# Patient Record
Sex: Female | Born: 1975 | Race: Asian | Hispanic: No | Marital: Married | Smoking: Never smoker
Health system: Southern US, Community
[De-identification: ages and names within clinical notes are randomized; demographics above are authoritative.]

## PROBLEM LIST (undated history)

## (undated) DIAGNOSIS — Z9889 Other specified postprocedural states: Secondary | ICD-10-CM

## (undated) DIAGNOSIS — R112 Nausea with vomiting, unspecified: Secondary | ICD-10-CM

## (undated) DIAGNOSIS — R51 Headache: Secondary | ICD-10-CM

## (undated) HISTORY — PX: BREAST SURGERY: SHX581

---

## 1997-11-03 ENCOUNTER — Inpatient Hospital Stay (HOSPITAL_COMMUNITY): Admission: RE | Admit: 1997-11-03 | Discharge: 1997-11-06 | Payer: Self-pay | Admitting: Obstetrics

## 1998-01-27 ENCOUNTER — Inpatient Hospital Stay (HOSPITAL_COMMUNITY): Admission: RE | Admit: 1998-01-27 | Discharge: 1998-01-27 | Payer: Self-pay | Admitting: *Deleted

## 1998-03-07 ENCOUNTER — Inpatient Hospital Stay (HOSPITAL_COMMUNITY): Admission: AD | Admit: 1998-03-07 | Discharge: 1998-03-09 | Payer: Self-pay | Admitting: *Deleted

## 1999-08-17 ENCOUNTER — Ambulatory Visit (HOSPITAL_COMMUNITY): Admission: RE | Admit: 1999-08-17 | Discharge: 1999-08-17 | Payer: Self-pay | Admitting: *Deleted

## 1999-10-03 ENCOUNTER — Ambulatory Visit (HOSPITAL_COMMUNITY): Admission: RE | Admit: 1999-10-03 | Discharge: 1999-10-03 | Payer: Self-pay | Admitting: *Deleted

## 2000-03-03 ENCOUNTER — Inpatient Hospital Stay (HOSPITAL_COMMUNITY): Admission: AD | Admit: 2000-03-03 | Discharge: 2000-03-04 | Payer: Self-pay | Admitting: *Deleted

## 2003-09-09 ENCOUNTER — Encounter: Admission: RE | Admit: 2003-09-09 | Discharge: 2003-09-09 | Payer: Self-pay | Admitting: Family Medicine

## 2003-09-09 ENCOUNTER — Other Ambulatory Visit: Admission: RE | Admit: 2003-09-09 | Discharge: 2003-09-09 | Payer: Self-pay | Admitting: Family Medicine

## 2003-09-09 ENCOUNTER — Encounter (INDEPENDENT_AMBULATORY_CARE_PROVIDER_SITE_OTHER): Payer: Self-pay | Admitting: *Deleted

## 2003-09-09 ENCOUNTER — Encounter (INDEPENDENT_AMBULATORY_CARE_PROVIDER_SITE_OTHER): Payer: Self-pay | Admitting: Specialist

## 2003-09-23 ENCOUNTER — Encounter: Admission: RE | Admit: 2003-09-23 | Discharge: 2003-09-23 | Payer: Self-pay | Admitting: Obstetrics and Gynecology

## 2003-11-11 ENCOUNTER — Encounter: Admission: RE | Admit: 2003-11-11 | Discharge: 2003-11-11 | Payer: Self-pay | Admitting: Family Medicine

## 2004-04-25 ENCOUNTER — Ambulatory Visit: Payer: Self-pay | Admitting: Obstetrics and Gynecology

## 2004-07-04 ENCOUNTER — Other Ambulatory Visit: Admission: RE | Admit: 2004-07-04 | Discharge: 2004-07-04 | Payer: Self-pay | Admitting: Obstetrics & Gynecology

## 2004-07-04 ENCOUNTER — Ambulatory Visit: Payer: Self-pay | Admitting: Obstetrics and Gynecology

## 2004-07-20 ENCOUNTER — Ambulatory Visit: Payer: Self-pay | Admitting: Family Medicine

## 2004-10-27 ENCOUNTER — Ambulatory Visit: Payer: Self-pay | Admitting: Family Medicine

## 2004-10-27 ENCOUNTER — Other Ambulatory Visit: Admission: RE | Admit: 2004-10-27 | Discharge: 2004-10-27 | Payer: Self-pay | Admitting: Family Medicine

## 2005-02-23 ENCOUNTER — Ambulatory Visit: Payer: Self-pay | Admitting: Family Medicine

## 2005-03-13 ENCOUNTER — Encounter: Payer: Self-pay | Admitting: Obstetrics and Gynecology

## 2005-03-13 ENCOUNTER — Ambulatory Visit: Payer: Self-pay | Admitting: Obstetrics and Gynecology

## 2006-03-08 ENCOUNTER — Encounter (INDEPENDENT_AMBULATORY_CARE_PROVIDER_SITE_OTHER): Payer: Self-pay | Admitting: Gynecology

## 2006-03-08 ENCOUNTER — Ambulatory Visit: Payer: Self-pay | Admitting: Gynecology

## 2008-02-20 ENCOUNTER — Encounter: Payer: Self-pay | Admitting: Obstetrics & Gynecology

## 2008-02-20 ENCOUNTER — Ambulatory Visit: Payer: Self-pay | Admitting: Obstetrics & Gynecology

## 2010-10-03 NOTE — Group Therapy Note (Signed)
NAMELetasha, [name]                    ACCOUNT NO.:  [id number]   MEDICAL RECORD NO.:  [id number]          PATIENT TYPE:  WOC   LOCATION:  [facility]                   FACILITY:  [facility]   PHYSICIAN:  Johnella Moloney, MD        DATE OF BIRTH:  1976-04-20   DATE OF SERVICE:                                  CLINIC NOTE   The patient is a 35 year old, gravida 2, para 2, with a last menstrual  period of January 23, 2008, who is here for her annual exam.  The  patient was last seen in October 2007 for her annual, at which point she  had a normal Pap smear.  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  The patient has been lost to follow up due to  various situations in her life, but is here today to re-initiate care.  The patient does not have any complaints.  She is sexually active with  her husband, does not have any problems during intercourse, denies any  intermenstrual bleeding or any other symptoms.  She is interested in  using a Mirena IUD for contraception.   PAST OB/GYN HISTORY:  G2, P2, vaginal delivery x2.  The patient has  regular menstrual periods which lasts 4 days.  She uses condoms for  birth control.  Currently, she has no problems during sexual  intercourse.  The patient had a history of CIN 3 in the past status post  LEEP in 2006.  She had a normal Pap smear in 2007 and has not had any  further Pap smears since then.   PAST MEDICAL HISTORY:  None.   PAST SURGICAL HISTORY:  LEEP.   MEDICATIONS:  None.   ALLERGIES:  None.   SOCIAL HISTORY:  The patient is currently unemployed.  She lives with  her family.  She denies any tobacco, alcohol, or illicit drug use.  Denies any current or past history of sexual or physical abuse.   Family history is remarkable for a history of cervical cancer and  hypertension.  No other gynecologic cancers, breast cancer, or colon  cancer.   REVIEW OF  SYSTEMS:  A 14-point comprehensive review of system was  reviewed with the patient and was entirely negative.   PHYSICAL EXAMINATION:  VITAL SIGNS:  Temperature is 98.5, pulse is 78,  respirations 16, blood pressure is 113/75, weight is 130 pounds, height  is 5 feet.  GENERAL:  In no apparent distress.  HEENT:  Normocephalic, atraumatic.  LUNGS:  Clear to auscultation bilaterally.  HEART:  Regular rate and rhythm.  ABDOMEN:  Soft, nontender, nondistended.  EXTREMITIES:  No clubbing, cyanosis, or edema.  Nontender.  PELVIC:  The patient has normal external female genitalia, pink well-  rugated vagina, no lesions, normal discharge.  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  An attempt was  made to pass the Cytobrush through the external os, but this was not  possible.  A Pap smear was obtained.  On bimanual exam, normal-sizes  uterus, mobile, nontender, normal adnexa bilaterally.   ASSESSMENT AND PLAN:  The patient is a 35 year old, G2, P2, status post  loop electrocautery excision procedure in 2006 who is here for annual  exam.  The patient had a normal breast examination.  A Pap smear sample  was obtained today; however, the patient was noted to have cervical  stenosis.  We will have to follow up the results of the Pap smear.  If  the Pap smear is insufficient given lack of endocervical samples, the  patient will be called and instructed to place Cytotec 200 mcg per  vagina the night before her next appointment.  Hopefully, this will help  alleviate some mild cervical stenosis and help in collecting an  endocervical specimen.  The patient was told the plan and was agreeing  to plan.  The cervical stenosis can also pose a problem for an  intrauterine device placement.  She was told that we will follow up the  Pap smears and will make other arrangements for her Mirena intrauterine  device placement.  Her reflex gonorrhea and Chlamydia were tested  from  the Pap sample to rule out infection.  We will follow up the results of  the Pap smear and act accordingly.            ______________________________  Johnella Moloney, MD     UD/MEDQ  D:  02/20/2008  T:  02/20/2008  Job:  [id number]

## 2010-10-06 NOTE — Group Therapy Note (Signed)
NAMEAdeli, [name]                    ACCOUNT NO.:  [id number]   MEDICAL RECORD NO.:  [id number]          PATIENT TYPE:  WOC   LOCATION:  [facility]                   FACILITY:  [facility]   PHYSICIAN:  Tinnie Gens, MD        DATE OF BIRTH:  Apr 01, 1976   DATE OF SERVICE:                                    CLINIC NOTE   CHIEF COMPLAINT:  LEEP.   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  Followup Pap smear showed CIN II again.  Colposcopy was performed, revealed  CIN II.  The patient was scheduled for LEEP.   PROCEDURE:  After informed consent, the patient was placed in dorsal  lithotomy.  A Teflon coated speculum was placed inside the vagina.  A  colposcopy was performed.  Acetyl white changes noted at the 10-12 o'clock  position with the end of lesion being seen out farther than the cervical  canal and in the transformation zone.  The cervix was injected with 10 mL of  lidocaine for a paracervical block.  A medium size Fisher loop was then used  to perform a LEEP conization.  The base of the conization was cauterized  with electrocautery and Monsel's applied to the base for hemostasis.  Good  hemostasis was noted and all instruments were removed from the patient's  vagina.   IMPRESSION:  CIN II, failed cryotherapy.   PLAN:  1.  Status post LEEP today.  2.  Followup Pap every four months.  3.  The patient was given post LEEP instructions and care.       TP/MEDQ  D:  10/27/2004  T:  10/27/2004  Job:  [id number]

## 2010-10-06 NOTE — Group Therapy Note (Signed)
NAMETametha, [name]                    ACCOUNT NO.:  [id number]   MEDICAL RECORD NO.:  [id number]          PATIENT TYPE:  WOC   LOCATION[facility]                   [facility]ACILITY:  [facility]   PHYSICIAN:  Argentina Donovan, MD        DATE OF BIRTH:  1976-04-21   DATE OF SERVICE:  03/13/2005                                    CLINIC NOTE   The patient is a 35 year old Asian female with a previous CIN III Pap smear  that had a LEEP in June of this year back for routine Pap which has been  taken.  The patient also wanted to discuss the NuvaRing.  We discussed it in  detail and gave her two samples of it.  We explained the European and  American method for using the ring and the possible complications.           ______________________________  Argentina Donovan, MD     PR/MEDQ  D:  03/13/2005  T:  03/14/2005  Job:  [id number]

## 2010-10-06 NOTE — Group Therapy Note (Signed)
NAMEAngellina, Ferdinand Kelly                    ACCOUNT NO.:  [id number]   MEDICAL RECORD NO.:  [id number]          PATIENT TYPE:  WOC   LOCATION[facility]                   [facility]ACILITY:  [facility]   PHYSICIAN:  Ginger Carne, MD DATE OF BIRTH:  1975/06/07   DATE OF SERVICE:                                    CLINIC NOTE   HISTORY OF PRESENT ILLNESS:  This patient is seen in clinic today for yearly  examination.  She had a CIN III findings on colposcopy and a LEEP was  performed on October 27, 2004.  The patient has no specific complaints.  Uses  NuvaRing for birth control and her menses are regular.   PHYSICAL EXAMINATION:  VITAL SIGNS:  Blood pressure 116/75, weight 129  pounds.  HEENT:  Grossly normal.  Breast exam without masses, discharge, thickenings  or tenderness, bilateral breast implants noted.  CARDIOVASCULAR:  Without  murmurs or enlargements,regular rate and rhythm.  LUNGS:  Clear  EXTREMITIES:  Lymphatic, skin, neurological, musculoskeletal, vascular  systems normal.  ABDOMEN:  Soft without gross hepatosplenomegaly.  PELVIC:  Pap smear performed.  External genitalia, vulva and vagina normal  Cervix smooth without erosions or lesions.  Uterus small, anteverted and  flexed and both adnexa normal.   IMPRESSION:  Normal gynecological evaluation.   PLAN:  The patient prescribed NuvaRing for 1 year used as directed, will  follow-up in 1 year time.           ______________________________  Ginger Carne, MD     SHB/MEDQ  D:  03/08/2006  T:  03/10/2006  Job:  [id number]

## 2012-07-23 ENCOUNTER — Other Ambulatory Visit (HOSPITAL_COMMUNITY): Payer: Self-pay | Admitting: Orthopaedic Surgery

## 2012-08-01 ENCOUNTER — Encounter (HOSPITAL_COMMUNITY): Payer: Self-pay | Admitting: Pharmacy Technician

## 2012-08-09 NOTE — Pre-Procedure Instructions (Signed)
[name]  08/09/2012   Your procedure is scheduled on:  Mon, Mar 31 @ 12:30 PM  Report to [facility] at 10:30 AM.  Call this number if you have problems the morning of surgery: [phone]   Remember:   Do not eat food or drink liquids after midnight.   Take these medicines the morning of surgery with A SIP OF WATER: Pain Pill(if needed)              Stop taking your Lodine and Ibuprofen.No Goody's,BC's,Aleve,Fish Oil,or any Herbal Medications   Do not wear jewelry, make-up or nail polish.  Do not wear lotions, powders, or perfumes. You may wear deodorant.  Do not shave 48 hours prior to surgery.  Do not bring valuables to the hospital.  Contacts, dentures or bridgework may not be worn into surgery.  Leave suitcase in the car. After surgery it may be brought to your room.  For patients admitted to the hospital, checkout time is 11:00 AM the day of  discharge.   Patients discharged the day of surgery will not be allowed to drive  home.    Special Instructions: Shower using CHG 2 nights before surgery and the night before surgery.  If you shower the day of surgery use CHG.  Use special wash - you have one bottle of CHG for all showers.  You should use approximately 1/3 of the bottle for each shower.   Please read over the following fact sheets that you were given: Pain Booklet, Coughing and Deep Breathing, MRSA Information and Surgical Site Infection Prevention

## 2012-08-11 ENCOUNTER — Encounter (HOSPITAL_COMMUNITY)
Admission: RE | Admit: 2012-08-11 | Discharge: 2012-08-11 | Disposition: A | Discharge status: Home / Self Care | Payer: BC Managed Care – PPO | Source: Ambulatory Visit | Attending: Orthopaedic Surgery | Admitting: Orthopaedic Surgery

## 2012-08-11 ENCOUNTER — Encounter (HOSPITAL_COMMUNITY): Payer: Self-pay

## 2012-08-11 HISTORY — DX: Headache: R51

## 2012-08-11 HISTORY — DX: Other specified postprocedural states: Z98.890

## 2012-08-11 HISTORY — DX: Nausea with vomiting, unspecified: R11.2

## 2012-08-11 LAB — URINALYSIS, ROUTINE W REFLEX MICROSCOPIC
Glucose, UA: NEGATIVE mg/dL
Leukocytes, UA: NEGATIVE
Nitrite: NEGATIVE
Protein, ur: NEGATIVE mg/dL

## 2012-08-11 LAB — CBC
HCT: 38.8 % (ref 36.0–46.0)
MCHC: 35.1 g/dL (ref 30.0–36.0)
MCV: 89.4 fL (ref 78.0–100.0)
RDW: 12.7 % (ref 11.5–15.5)

## 2012-08-11 LAB — COMPREHENSIVE METABOLIC PANEL
Albumin: 4 g/dL (ref 3.5–5.2)
BUN: 8 mg/dL (ref 6–23)
Chloride: 99 mEq/L (ref 96–112)
Creatinine, Ser: 0.53 mg/dL (ref 0.50–1.10)
GFR calc Af Amer: >90 mL/min (ref >90)
Total Bilirubin: 0.9 mg/dL (ref 0.3–1.2)
Total Protein: 7.6 g/dL (ref 6.0–8.3)

## 2012-08-11 LAB — HCG, SERUM, QUALITATIVE: Preg, Serum: NEGATIVE

## 2012-08-14 NOTE — H&P (Signed)
  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  She had 1 epidural that was placed at 5-1 due to the severe stenosis at the 4-5 level.  She has lost weight.  She has been on pain medication for several months.  She has had symptoms for greater than 6 months.  She has taken Neurontin, done activity modification, and she has back pain, right leg pain, numbness that radiates down to the dorsum of her foot, significantly increased pain with activities, and sometimes has to stop and go lie down to get some degree of relief.  She is continuing to take gabapentin 300 mg twice a day.  She has been on tramadol in the past; takes potassium chloride 50 mg p.o. daily.  She has been on Flexeril 10 mg at night.  She has not had any previous surgeries. No known drug allergies.   FAMILY HISTORY:  Positive for diabetes and hypertension.   SOCIAL HISTORY:  She is married to her husband, Eustace Pen.  She does not smoke or drink.   FOURTEEN-POINT REVIEW OF SYSTEMS:  Negative for rheumatologic disease.   PHYSICAL EXAMINATION:  Patient is alert and oriented, well developed, 5 feet tall, 112 pounds.  Extraocular movements intact.  Lungs:  Clear.  Heart:  Regular rate and rhythm without murmur or gallop.  Abdomen:  Soft, nontender.  She has positive straight leg raising on the right at 50 degrees.  Positive popliteal compression test on the right, negative on the left.  EHL is 1 grade weak.  She has dorsal foot numbness on the right, none on the left.  She fatigues out with resisted anterior tib function.   ASSESSMENT:  Large extruded HNP.  She has failed time, anti-inflammatories, medications, and has had symptoms greater than 6  months; has had epidural steroids that gave her about 50% relief.   PLAN:  She states she would like to proceed with operative intervention.  Her MRI scan was 06/25/2012.  The plan would be microdiskectomy, overnight stay.  Procedure discussed; risks of bleeding, infection, dural tear, re-operation, and disk herniation.  Procedure discussed and risks discussed.  She understands and requests we proceed.   For additional information please see handwritten notes, reports, orders and prescriptions in this chart.      Mark C. Ophelia Charter, M.D.

## 2012-08-18 ENCOUNTER — Ambulatory Visit (HOSPITAL_COMMUNITY): Payer: BC Managed Care – PPO | Admitting: Certified Registered"

## 2012-08-18 ENCOUNTER — Encounter (HOSPITAL_COMMUNITY)
Admission: RE | Disposition: A | Discharge status: Home / Self Care | Payer: Self-pay | Source: Ambulatory Visit | Attending: Orthopaedic Surgery

## 2012-08-18 ENCOUNTER — Encounter (HOSPITAL_COMMUNITY): Payer: Self-pay | Admitting: *Deleted

## 2012-08-18 ENCOUNTER — Ambulatory Visit (HOSPITAL_COMMUNITY): Payer: BC Managed Care – PPO

## 2012-08-18 ENCOUNTER — Encounter (HOSPITAL_COMMUNITY): Payer: Self-pay | Admitting: Certified Registered"

## 2012-08-18 ENCOUNTER — Observation Stay (HOSPITAL_COMMUNITY)
Admission: RE | Admit: 2012-08-18 | Discharge: 2012-08-19 | Disposition: A | Discharge status: Home / Self Care | Payer: BC Managed Care – PPO | Source: Ambulatory Visit | Attending: Orthopaedic Surgery | Admitting: Orthopaedic Surgery

## 2012-08-18 DIAGNOSIS — M5126 Other intervertebral disc displacement, lumbar region: Principal | ICD-10-CM | POA: Diagnosis present

## 2012-08-18 DIAGNOSIS — Z01812 Encounter for preprocedural laboratory examination: Secondary | ICD-10-CM | POA: Insufficient documentation

## 2012-08-18 DIAGNOSIS — Z01810 Encounter for preprocedural cardiovascular examination: Secondary | ICD-10-CM | POA: Insufficient documentation

## 2012-08-18 DIAGNOSIS — Z01818 Encounter for other preprocedural examination: Secondary | ICD-10-CM | POA: Insufficient documentation

## 2012-08-18 DIAGNOSIS — M216X9 Other acquired deformities of unspecified foot: Secondary | ICD-10-CM | POA: Insufficient documentation

## 2012-08-18 HISTORY — PX: LUMBAR LAMINECTOMY: SHX95

## 2012-08-18 SURGERY — MICRODISCECTOMY LUMBAR LAMINECTOMY
Anesthesia: General | Site: Spine Lumbar | Laterality: Right | Wound class: Clean

## 2012-08-18 MED ORDER — SENNOSIDES-DOCUSATE SODIUM 8.6-50 MG PO TABS: 1.0000 | ORAL_TABLET | Freq: Every evening | ORAL | Status: DC | PRN

## 2012-08-18 MED ORDER — SCOPOLAMINE 1 MG/3DAYS TD PT72
MEDICATED_PATCH | TRANSDERMAL | Status: AC
  Administered 2012-08-18: 1 via TRANSDERMAL
  Filled 2012-08-18: qty 1

## 2012-08-18 MED ORDER — METHOCARBAMOL 100 MG/ML IJ SOLN
500.0000 mg | Freq: Four times a day (QID) | INTRAVENOUS | Status: DC | PRN
  Filled 2012-08-18: qty 5

## 2012-08-18 MED ORDER — ROCURONIUM BROMIDE 100 MG/10ML IV SOLN
INTRAVENOUS | Status: DC | PRN
  Administered 2012-08-18: 40 mg via INTRAVENOUS

## 2012-08-18 MED ORDER — HYDROCODONE-ACETAMINOPHEN 5-325 MG PO TABS
1.0000 | ORAL_TABLET | ORAL | Status: DC | PRN
  Administered 2012-08-19 (×2): 2 via ORAL
  Filled 2012-08-18 (×2): qty 2

## 2012-08-18 MED ORDER — ACETAMINOPHEN 650 MG RE SUPP: 650.0000 mg | RECTAL | Status: DC | PRN

## 2012-08-18 MED ORDER — NEOSTIGMINE METHYLSULFATE 1 MG/ML IJ SOLN
INTRAMUSCULAR | Status: DC | PRN
  Administered 2012-08-18: 4 mg via INTRAVENOUS

## 2012-08-18 MED ORDER — SODIUM CHLORIDE 0.9 % IV SOLN: 250.0000 mL | INTRAVENOUS | Status: DC

## 2012-08-18 MED ORDER — LACTATED RINGERS IV SOLN
INTRAVENOUS | Status: DC
  Administered 2012-08-18: 12:00:00 via INTRAVENOUS

## 2012-08-18 MED ORDER — OXYCODONE-ACETAMINOPHEN 5-325 MG PO TABS
ORAL_TABLET | ORAL | Status: AC
  Filled 2012-08-18: qty 1

## 2012-08-18 MED ORDER — PROPOFOL 10 MG/ML IV BOLUS
INTRAVENOUS | Status: DC | PRN
  Administered 2012-08-18: 140 mg via INTRAVENOUS

## 2012-08-18 MED ORDER — ACETAMINOPHEN 325 MG PO TABS: 650.0000 mg | ORAL_TABLET | ORAL | Status: DC | PRN

## 2012-08-18 MED ORDER — HYDROCODONE-ACETAMINOPHEN 5-325 MG PO TABS: 1.0000 | ORAL_TABLET | ORAL | Status: DC | PRN

## 2012-08-18 MED ORDER — ZOLPIDEM TARTRATE 5 MG PO TABS: 5.0000 mg | ORAL_TABLET | Freq: Every evening | ORAL | Status: DC | PRN

## 2012-08-18 MED ORDER — BISACODYL 10 MG RE SUPP: 10.0000 mg | Freq: Every day | RECTAL | Status: DC | PRN

## 2012-08-18 MED ORDER — ETODOLAC 400 MG PO TABS
400.0000 mg | ORAL_TABLET | Freq: Two times a day (BID) | ORAL | Status: DC | PRN
  Filled 2012-08-18: qty 1

## 2012-08-18 MED ORDER — ESMOLOL HCL 10 MG/ML IV SOLN
INTRAVENOUS | Status: DC | PRN
  Administered 2012-08-18 (×2): 50 mg via INTRAVENOUS

## 2012-08-18 MED ORDER — DOCUSATE SODIUM 100 MG PO CAPS
100.0000 mg | ORAL_CAPSULE | Freq: Two times a day (BID) | ORAL | Status: DC
  Administered 2012-08-18 – 2012-08-19 (×2): 100 mg via ORAL
  Filled 2012-08-18 (×3): qty 1

## 2012-08-18 MED ORDER — MORPHINE SULFATE 2 MG/ML IJ SOLN: 1.0000 mg | INTRAMUSCULAR | Status: DC | PRN

## 2012-08-18 MED ORDER — MENTHOL 3 MG MT LOZG: 1.0000 | LOZENGE | OROMUCOSAL | Status: DC | PRN

## 2012-08-18 MED ORDER — FENTANYL CITRATE 0.05 MG/ML IJ SOLN
INTRAMUSCULAR | Status: DC | PRN
  Administered 2012-08-18 (×2): 50 ug via INTRAVENOUS

## 2012-08-18 MED ORDER — OXYCODONE-ACETAMINOPHEN 5-325 MG PO TABS
1.0000 | ORAL_TABLET | ORAL | Status: DC | PRN
  Administered 2012-08-18: 1 via ORAL

## 2012-08-18 MED ORDER — LIDOCAINE HCL (CARDIAC) 20 MG/ML IV SOLN
INTRAVENOUS | Status: DC | PRN
  Administered 2012-08-18: 80 mg via INTRAVENOUS

## 2012-08-18 MED ORDER — METHOCARBAMOL 500 MG PO TABS: 500.0000 mg | ORAL_TABLET | Freq: Four times a day (QID) | ORAL | Status: DC | PRN

## 2012-08-18 MED ORDER — SODIUM CHLORIDE 0.9 % IJ SOLN
3.0000 mL | Freq: Two times a day (BID) | INTRAMUSCULAR | Status: DC
  Administered 2012-08-18: 3 mL via INTRAVENOUS

## 2012-08-18 MED ORDER — PANTOPRAZOLE SODIUM 40 MG IV SOLR
40.0000 mg | Freq: Every day | INTRAVENOUS | Status: DC
  Administered 2012-08-18: 40 mg via INTRAVENOUS
  Filled 2012-08-18 (×2): qty 40

## 2012-08-18 MED ORDER — GLYCOPYRROLATE 0.2 MG/ML IJ SOLN
INTRAMUSCULAR | Status: DC | PRN
  Administered 2012-08-18: .7 mg via INTRAVENOUS

## 2012-08-18 MED ORDER — KCL IN DEXTROSE-NACL 20-5-0.45 MEQ/L-%-% IV SOLN
INTRAVENOUS | Status: DC
  Filled 2012-08-18 (×3): qty 1000

## 2012-08-18 MED ORDER — PHENOL 1.4 % MT LIQD: 1.0000 | OROMUCOSAL | Status: DC | PRN

## 2012-08-18 MED ORDER — METHOCARBAMOL 500 MG PO TABS
ORAL_TABLET | ORAL | Status: AC
  Filled 2012-08-18: qty 1

## 2012-08-18 MED ORDER — HYDROMORPHONE HCL PF 1 MG/ML IJ SOLN: 0.2500 mg | INTRAMUSCULAR | Status: DC | PRN

## 2012-08-18 MED ORDER — ACETAMINOPHEN 10 MG/ML IV SOLN: 1000.0000 mg | Freq: Once | INTRAVENOUS | Status: DC | PRN

## 2012-08-18 MED ORDER — FLEET ENEMA 7-19 GM/118ML RE ENEM: 1.0000 | ENEMA | Freq: Once | RECTAL | Status: AC | PRN

## 2012-08-18 MED ORDER — METHOCARBAMOL 500 MG PO TABS
500.0000 mg | ORAL_TABLET | Freq: Four times a day (QID) | ORAL | Status: DC | PRN
  Administered 2012-08-18: 500 mg via ORAL
  Filled 2012-08-18: qty 1

## 2012-08-18 MED ORDER — CEFAZOLIN SODIUM 1-5 GM-% IV SOLN
1.0000 g | Freq: Three times a day (TID) | INTRAVENOUS | Status: AC
  Administered 2012-08-18 – 2012-08-19 (×2): 1 g via INTRAVENOUS
  Filled 2012-08-18 (×2): qty 50

## 2012-08-18 MED ORDER — SODIUM CHLORIDE 0.9 % IJ SOLN: 3.0000 mL | INTRAMUSCULAR | Status: DC | PRN

## 2012-08-18 MED ORDER — ONDANSETRON HCL 4 MG/2ML IJ SOLN
INTRAMUSCULAR | Status: DC | PRN
  Administered 2012-08-18: 4 mg via INTRAVENOUS

## 2012-08-18 MED ORDER — MIDAZOLAM HCL 5 MG/5ML IJ SOLN
INTRAMUSCULAR | Status: DC | PRN
  Administered 2012-08-18: 2 mg via INTRAVENOUS

## 2012-08-18 MED ORDER — CEFAZOLIN SODIUM-DEXTROSE 2-3 GM-% IV SOLR
INTRAVENOUS | Status: AC
  Filled 2012-08-18: qty 50

## 2012-08-18 MED ORDER — BUPIVACAINE HCL (PF) 0.25 % IJ SOLN
INTRAMUSCULAR | Status: AC
  Filled 2012-08-18: qty 30

## 2012-08-18 MED ORDER — ONDANSETRON HCL 4 MG/2ML IJ SOLN: 4.0000 mg | Freq: Once | INTRAMUSCULAR | Status: DC | PRN

## 2012-08-18 MED ORDER — DEXAMETHASONE SODIUM PHOSPHATE 10 MG/ML IJ SOLN
INTRAMUSCULAR | Status: DC | PRN
  Administered 2012-08-18: 8 mg via INTRAVENOUS

## 2012-08-18 MED ORDER — 0.9 % SODIUM CHLORIDE (POUR BTL) OPTIME
TOPICAL | Status: DC | PRN
  Administered 2012-08-18: 1000 mL

## 2012-08-18 MED ORDER — CEFAZOLIN SODIUM-DEXTROSE 2-3 GM-% IV SOLR
2.0000 g | INTRAVENOUS | Status: AC
Start: 2012-08-18 — End: 2012-08-18
  Administered 2012-08-18: 2 g via INTRAVENOUS
  Filled 2012-08-18: qty 50

## 2012-08-18 MED ORDER — HYDROMORPHONE HCL PF 1 MG/ML IJ SOLN
INTRAMUSCULAR | Status: AC
  Administered 2012-08-18: 1 mg
  Filled 2012-08-18: qty 1

## 2012-08-18 MED ORDER — ONDANSETRON HCL 4 MG/2ML IJ SOLN: 4.0000 mg | INTRAMUSCULAR | Status: DC | PRN

## 2012-08-18 MED ORDER — LACTATED RINGERS IV SOLN
INTRAVENOUS | Status: DC | PRN
  Administered 2012-08-18 (×2): via INTRAVENOUS

## 2012-08-18 SURGICAL SUPPLY — 48 items
ADH SKN CLS APL DERMABOND .7 (GAUZE/BANDAGES/DRESSINGS)
BUR ROUND FLUTED 4 SOFT TCH (BURR) IMPLANT
CLOTH BEACON ORANGE TIMEOUT ST (SAFETY) ×2 IMPLANT
CORDS BIPOLAR (ELECTRODE) ×2 IMPLANT
COVER SURGICAL LIGHT HANDLE (MISCELLANEOUS) ×2 IMPLANT
DERMABOND ADVANCED (GAUZE/BANDAGES/DRESSINGS)
DERMABOND ADVANCED .7 DNX12 (GAUZE/BANDAGES/DRESSINGS) IMPLANT
DRAPE MICROSCOPE LEICA (MISCELLANEOUS) ×2 IMPLANT
DRAPE PROXIMA HALF (DRAPES) ×2 IMPLANT
DRSG EMULSION OIL 3X3 NADH (GAUZE/BANDAGES/DRESSINGS) IMPLANT
DRSG MEPILEX BORDER 4X4 (GAUZE/BANDAGES/DRESSINGS) IMPLANT
DRSG MEPILEX BORDER 4X8 (GAUZE/BANDAGES/DRESSINGS) IMPLANT
DURAPREP 26ML APPLICATOR (WOUND CARE) ×2 IMPLANT
ELECT REM PT RETURN 9FT ADLT (ELECTROSURGICAL) ×2
ELECTRODE REM PT RTRN 9FT ADLT (ELECTROSURGICAL) ×1 IMPLANT
GLOVE BIOGEL PI IND STRL 7.5 (GLOVE) ×1 IMPLANT
GLOVE BIOGEL PI IND STRL 8 (GLOVE) ×1 IMPLANT
GLOVE BIOGEL PI INDICATOR 7.5 (GLOVE) ×1
GLOVE BIOGEL PI INDICATOR 8 (GLOVE) ×1
GLOVE ECLIPSE 7.0 STRL STRAW (GLOVE) ×2 IMPLANT
GLOVE ORTHO TXT STRL SZ7.5 (GLOVE) ×2 IMPLANT
GOWN PREVENTION PLUS LG XLONG (DISPOSABLE) IMPLANT
GOWN STRL NON-REIN LRG LVL3 (GOWN DISPOSABLE) ×6 IMPLANT
KIT BASIN OR (CUSTOM PROCEDURE TRAY) ×2 IMPLANT
KIT ROOM TURNOVER OR (KITS) ×2 IMPLANT
MANIFOLD NEPTUNE II (INSTRUMENTS) ×2 IMPLANT
NDL SUT .5 MAYO 1.404X.05X (NEEDLE) IMPLANT
NEEDLE HYPO 25GX1X1/2 BEV (NEEDLE) ×2 IMPLANT
NEEDLE MAYO TAPER (NEEDLE)
NEEDLE SPNL 18GX3.5 QUINCKE PK (NEEDLE) ×2 IMPLANT
NS IRRIG 1000ML POUR BTL (IV SOLUTION) ×2 IMPLANT
PACK LAMINECTOMY ORTHO (CUSTOM PROCEDURE TRAY) ×2 IMPLANT
PAD ARMBOARD 7.5X6 YLW CONV (MISCELLANEOUS) ×4 IMPLANT
PATTIES SURGICAL .5 X.5 (GAUZE/BANDAGES/DRESSINGS) IMPLANT
PATTIES SURGICAL .75X.75 (GAUZE/BANDAGES/DRESSINGS) IMPLANT
SPONGE GAUZE 4X4 12PLY (GAUZE/BANDAGES/DRESSINGS) ×2 IMPLANT
SUT VIC AB 0 CT1 27 (SUTURE) ×2
SUT VIC AB 0 CT1 27XBRD ANBCTR (SUTURE) ×1 IMPLANT
SUT VIC AB 2-0 CT1 27 (SUTURE) ×2
SUT VIC AB 2-0 CT1 TAPERPNT 27 (SUTURE) ×1 IMPLANT
SUT VICRYL 0 TIES 12 18 (SUTURE) IMPLANT
SUT VICRYL 4-0 PS2 18IN ABS (SUTURE) IMPLANT
SUT VICRYL AB 2 0 TIES (SUTURE) IMPLANT
SYR 20ML ECCENTRIC (SYRINGE) IMPLANT
SYR CONTROL 10ML LL (SYRINGE) ×2 IMPLANT
TOWEL OR 17X24 6PK STRL BLUE (TOWEL DISPOSABLE) ×2 IMPLANT
TOWEL OR 17X26 10 PK STRL BLUE (TOWEL DISPOSABLE) ×2 IMPLANT
WATER STERILE IRR 1000ML POUR (IV SOLUTION) ×2 IMPLANT

## 2012-08-18 NOTE — Interval H&P Note (Signed)
History and Physical Interval Note:  08/18/2012 12:41 PM  [name]  has presented today for surgery, with the diagnosis of Right L4-5 HNP  The various methods of treatment have been discussed with the patient and family. After consideration of risks, benefits and other options for treatment, the patient has consented to  Procedure(s) with comments: MICRODISCECTOMY LUMBAR LAMINECTOMY (Right) - Right L4-5 Microdiscectomy as a surgical intervention .  The patient's history has been reviewed, patient examined, no change in status, stable for surgery.  I have reviewed the patient's chart and labs.  Questions were answered to the patient's satisfaction.     Ewell Benassi C

## 2012-08-18 NOTE — Transfer of Care (Signed)
Immediate Anesthesia Transfer of Care Note  Patient: [name]  Procedure(s) Performed: Procedure(s) with comments: MICRODISCECTOMY LUMBAR LAMINECTOMY (Right) - Right L4-5 Microdiscectomy  Patient Location: PACU  Anesthesia Type:General  Level of Consciousness: awake, alert  and oriented  Airway & Oxygen Therapy: Patient Spontanous Breathing and Patient connected to nasal cannula oxygen  Post-op Assessment: Report given to PACU RN, Post -op Vital signs reviewed and stable and Patient moving all extremities  Post vital signs: Reviewed and stable  Complications: No apparent anesthesia complications

## 2012-08-18 NOTE — Preoperative (Signed)
Beta Blockers   Reason not to administer Beta Blockers:Not Applicable 

## 2012-08-18 NOTE — Brief Op Note (Cosign Needed)
08/18/2012  2:14 PM  PATIENT:  [name]  37 y.o. female  PRE-OPERATIVE DIAGNOSIS:  Right L4-5 HNP  POST-OPERATIVE DIAGNOSIS:  right L4-5 HNP  PROCEDURE:  Procedure(s) with comments: MICRODISCECTOMY LUMBAR LAMINECTOMY (Right) - Right L4-5 Microdiscectomy  SURGEON:  Surgeon(s) and Role:    * Eldred Manges, MD - Primary  PHYSICIAN ASSISTANT: Maud Deed [facility]  ASSISTANTS: none   ANESTHESIA:   none  EBL:  Total I/O In: 1000 [I.V.:1000] Out: -   BLOOD ADMINISTERED:none  DRAINS: none   LOCAL MEDICATIONS USED:  NONE  SPECIMEN:  No Specimen  DISPOSITION OF SPECIMEN:  N/A  COUNTS:  YES  TOURNIQUET:  * No tourniquets in log *  DICTATION: .Note written in EPIC  PLAN OF CARE: Admit for overnight observation  PATIENT DISPOSITION:  PACU - hemodynamically stable.   Delay start of Pharmacological VTE agent (>24hrs) due to surgical blood loss or risk of bleeding: yes

## 2012-08-18 NOTE — Anesthesia Preprocedure Evaluation (Addendum)
Anesthesia Evaluation  Patient identified by MRN, date of birth, ID band Patient awake  General Assessment Comment:Emergency heart  Reviewed: Allergy & Precautions, H&P , NPO status , Patient's Chart, lab work & pertinent test results  Airway Mallampati: II      Dental   Pulmonary  breath sounds clear to auscultation        Cardiovascular negative cardio ROS  Rhythm:Regular Rate:Normal  Emergency heart   Neuro/Psych    GI/Hepatic negative GI ROS, Neg liver ROS,   Endo/Other  negative endocrine ROS  Renal/GU negative Renal ROS     Musculoskeletal   Abdominal   Peds  Hematology   Anesthesia Other Findings   Reproductive/Obstetrics                         Anesthesia Physical Anesthesia Plan  ASA: II  Anesthesia Plan: General   Post-op Pain Management:    Induction: Intravenous  Airway Management Planned: Oral ETT  Additional Equipment:   Intra-op Plan:   Post-operative Plan: Post-operative intubation/ventilation  Informed Consent: I have reviewed the patients History and Physical, chart, labs and discussed the procedure including the risks, benefits and alternatives for the proposed anesthesia with the patient or authorized representative who has indicated his/her understanding and acceptance.   Dental advisory given  Plan Discussed with: CRNA, Anesthesiologist and Surgeon  Anesthesia Plan Comments:        Anesthesia Quick Evaluation

## 2012-08-18 NOTE — Anesthesia Postprocedure Evaluation (Signed)
  Anesthesia Post-op Note  Patient: [name]  Procedure(s) Performed: Procedure(s) with comments: MICRODISCECTOMY LUMBAR LAMINECTOMY (Right) - Right L4-5 Microdiscectomy  Patient Location: PACU  Anesthesia Type:General  Level of Consciousness: awake  Airway and Oxygen Therapy: Patient Spontanous Breathing  Post-op Pain: mild  Post-op Assessment: Post-op Vital signs reviewed  Post-op Vital Signs: Reviewed  Complications: No apparent anesthesia complications

## 2012-08-19 ENCOUNTER — Encounter (HOSPITAL_COMMUNITY): Payer: Self-pay | Admitting: Orthopaedic Surgery

## 2012-08-19 NOTE — Progress Notes (Signed)
Patient discharged in stable condition home via wheelchair. Discharge instructions and prescriptions were given and explained.

## 2012-08-19 NOTE — Op Note (Signed)
NAMEMaclaine, [name]                    ACCOUNT NO.:  [id number]  MEDICAL RECORD NO.:  [id number]  LOCATION:  5N09C                        FACILITY:  [facility]  PHYSICIAN:  Mark C. Ophelia Charter, M.D.    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  PROCEDURE:  Removal of large herniated nucleus pulposus, right L4-5 with L4 laminotomy.  SURGEON:  Mark C. Ophelia Charter, M.D.  ASSISTANT:  Wende Neighbors, P.A.  ANESTHESIA:  General plus Marcaine skin local.  COMPLICATIONS:  None.  HISTORY:  A 37 year old female has had persistent problems with pain, footdrop, failed conservative treatment.  She has been treated with narcotic pain medications.  Symptoms worse for over 6 months.  She has had epidurals, Medrol Dosepak, NSAIDS,muscle relaxants,therapy , narcotic pain medication.  MRI scan showed very large HNP at L4-5 with about 3 mm canal remaining due to the massive disk herniation at 4-5 with some cephalad extrusion.  PROCEDURE:  After induction of general anesthesia, the patient was placed in the prone position.  Careful padding and positioning.  Back was prepped with DuraPrep.  The area was squared with towels, Betadine and Steri-Drape applied.  Once time-out procedure was completed and the DuraPrep was dry, laminectomy sheet and drape, spinal needle placed at 4- 5 level.  Cross-table lateral x-ray confirmed it as appropriate level. Small incision was made 2 mm to the right of midline.  Subperiosteal dissection to the 4-5 level and Taylor retractor placed laterally. Laminotomy was performed, some overhanging spurs were trimmed and then a Penfield was placed at 4-5 level, confirmed with a second x-ray  with the Penfield at the laminotomy defect.  Some ligamentum was removed.  Nerve root was identified, but could not be retracted towards the midline. Caudally with the nerve root pushed out laterally against the wall, the inferior aspect of the disk was visualized.  D'Errico was used to protect the midline.  Initially a Penfield 4 was used for this and a stab incision was made at the inferior aspect of the annulus and multiple large chunks of disk were removed.  Once this was performed sliding cephalad and lateral, nerve was then able to be moved towards the midline, pulling it over the large massive piece of disk, which were rasped, removed with multiple chunks using the micro and regular pituitary chunks were teased out.  There were multiple chunks removed from the disk space.  Pieces were teased with a ball-tipped nerve hook, and hockey stick and operative field removed until hockey stick could be swept anterior 180 degrees that areas of compression.  The disk was well decompressed.  After stents were used out laterally on the right side as well as the midline until fragments completely removed.  MRI scan was rechecked, which was displayed on the big screen in the operating room to make sure that all findings were consistent with the scan.  Max compression was exactly  at the level of the disk extended about to the midline possibly 1 or 2 mm past the midline and was severely dorsally displacing in the nerve root.  After decompression, nerve roots laid flat, went down around the pedicle.  There was nothing out the foramina below.  Lateral recess was decompressed.  Couple small veins were coagulated with the bipolar cautery.  Disk space was irrigated copiously.  Dura and nerve root were intact.  Repeat irrigation one final time.  Then closure of deep layer with #1 Vicryl, 2-0 Vicryl in the subcutaneous tissue, subcuticular closure.  Dermabond, postop dressing, and transferred to the  recovery room.  Instrument count and needle count was correct.     Mark C. Ophelia Charter, M.D.     MCY/MEDQ  D:  08/18/2012  T:  08/19/2012  Job:  [id number]

## 2012-08-19 NOTE — Progress Notes (Signed)
Patient ID: [name], female   DOB: 10/21/1975, 37 y.o.   MRN: [id number] Ambulatory to bathroom good leg strength, no dorsiflexion weakness, still with foot numbness as expected after large HNP removal. No right  leg pain . Discharge home this AM.  rov one week.

## 2012-09-08 NOTE — Discharge Summary (Signed)
Physician Discharge Summary  Patient ID: [name] MRN: [id number] DOB/AGE: [birth year] 37 y.o.  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 PONV (postoperative nausea and vomiting)   . Headache     Surgeries: Procedure(s): MICRODISCECTOMY LUMBAR LAMINECTOMY RIGHT L4-5 on 08/18/2012   Consultants (if any):  NONE  Discharged Condition: Improved  Hospital Course: [name] is an 37 y.o. female who was admitted 08/18/2012 with a diagnosis of HNP (herniated nucleus pulposus), lumbar and went to the operating room on 08/18/2012 and underwent the above named procedures.    She was given perioperative antibiotics:  Anti-infectives   Start     Dose/Rate Route Frequency Ordered Stop   08/18/12 2000  ceFAZolin (ANCEF) IVPB 1 g/50 mL premix     1 g 100 mL/hr over 30 Minutes Intravenous Every 8 hours 08/18/12 1743 08/19/12 0502   08/18/12 1247  ceFAZolin (ANCEF) 2-3 GM-% IVPB SOLR  Status:  Discontinued    Comments:  HUNT, JENNIFER: cabinet override      08/18/12 1247 08/18/12 1533   08/18/12 0027  ceFAZolin (ANCEF) IVPB 2 g/50 mL premix     2 g 100 mL/hr over 30 Minutes Intravenous On call to O.R. 08/18/12 4098 08/18/12 1302    .  She was given sequential compression devices, early ambulation for DVT prophylaxis.  She benefited maximally from the hospital stay and there were no complications.    Recent vital signs:  Filed Vitals:   08/19/12 0558  BP: 99/54  Pulse: 78  Temp: 98.2 F (36.8 C)  Resp: 18    Recent laboratory studies:  Lab Results  Component Value Date   HGB 13.6 08/11/2012   Lab Results  Component Value Date   WBC 9.7 08/11/2012   PLT 280 08/11/2012   No results found for this basename: INR   Lab Results  Component Value  Date   NA 138 08/11/2012   K 4.0 08/11/2012   CL 99 08/11/2012   CO2 29 08/11/2012   BUN 8 08/11/2012   CREATININE 0.53 08/11/2012   GLUCOSE 88 08/11/2012    Discharge Medications:     Medication List    TAKE these medications       ADVIL PM PO  Take 1 tablet by mouth at bedtime as needed (to help sleep).     etodolac 400 MG tablet  Commonly known as:  LODINE  Take 400 mg by mouth 2 (two) times daily as needed (pain).     HYDROcodone-acetaminophen 5-325 MG per tablet  Commonly known as:  NORCO  Take 1-2 tablets by mouth every 4 (four) hours as needed for pain.     methocarbamol 500 MG tablet  Commonly known as:  ROBAXIN  Take 1 tablet (500 mg total) by mouth every 6 (six) hours as needed (spasm).        Diagnostic Studies: Dg Chest 2 View  08/11/2012  *RADIOLOGY REPORT*  Clinical Data: Preoperative study for low back surgery.  CHEST - 2 VIEW  Comparison: No priors.  Findings: Lung volumes are normal.  No consolidative airspace disease.  No pleural effusions.  No pneumothorax.  No pulmonary nodule or mass noted.  Pulmonary vasculature and the cardiomediastinal silhouette are within normal limits.  IMPRESSION: 1. No radiographic evidence of acute cardiopulmonary disease.  Original Report Authenticated By: Trudie Reed, M.D.    Dg Lumbar Spine 2-3 Views  08/18/2012  *RADIOLOGY REPORT*  Clinical Data: Lumbar HNP.  LUMBAR SPINE - 2-3 VIEW  Comparison: 06/24/2012 MRI  Findings: 2 intraoperative views.  The first, labeled 1316 hours, demonstrates a surgical device localizing the L4-L5 level. Straightening of expected lordosis.  The second image, labeled 1324 hours, also localizes the L4-L5 interspace.  IMPRESSION: Intraoperative localization of L4-L5.   Original Report Authenticated By: Jeronimo Greaves, M.D.     Disposition: 01-Home or Self Care  INSTRUCTIONS AT DISCHARGE: No lifting greater than 10 lbs. Avoid bending, stooping and twisting. Walk in house for first week them may start  to get out slowly increasing distance up to one mile by 3 weeks post op. Keep incision dry for 3 days, may use tegaderm or similar water impervious dressing.      Follow-up Information   Follow up with Eldred Manges, MD. Schedule an appointment as soon as possible for a visit in 2 weeks.   Contact information:   [address] [place] [address] [phone]        Signed: Wende Neighbors 09/08/2012, 9:54 AM

## 2013-01-12 ENCOUNTER — Other Ambulatory Visit (HOSPITAL_COMMUNITY): Payer: Self-pay | Admitting: Orthopaedic Surgery

## 2013-01-30 ENCOUNTER — Encounter (HOSPITAL_COMMUNITY): Payer: Self-pay | Admitting: Respiratory Therapy

## 2013-02-02 ENCOUNTER — Encounter (HOSPITAL_COMMUNITY): Payer: Self-pay

## 2013-02-02 ENCOUNTER — Other Ambulatory Visit (HOSPITAL_COMMUNITY): Payer: BC Managed Care – PPO

## 2013-02-02 ENCOUNTER — Encounter (HOSPITAL_COMMUNITY)
Admission: RE | Admit: 2013-02-02 | Discharge: 2013-02-02 | Disposition: A | Discharge status: Home / Self Care | Payer: BC Managed Care – PPO | Source: Ambulatory Visit | Attending: Orthopaedic Surgery | Admitting: Orthopaedic Surgery

## 2013-02-02 DIAGNOSIS — Z01818 Encounter for other preprocedural examination: Secondary | ICD-10-CM | POA: Insufficient documentation

## 2013-02-02 DIAGNOSIS — Z01812 Encounter for preprocedural laboratory examination: Secondary | ICD-10-CM | POA: Insufficient documentation

## 2013-02-02 LAB — COMPREHENSIVE METABOLIC PANEL
ALT: 15 U/L (ref 0–35)
AST: 19 U/L (ref 0–37)
CO2: 24 mEq/L (ref 19–32)
Calcium: 9.7 mg/dL (ref 8.4–10.5)
Chloride: 100 mEq/L (ref 96–112)
Creatinine, Ser: 0.49 mg/dL — ABNORMAL LOW (ref 0.50–1.10)
GFR calc Af Amer: >90 mL/min (ref >90)
GFR calc non Af Amer: >90 mL/min (ref >90)
Glucose, Bld: 92 mg/dL (ref 70–99)
Sodium: 136 mEq/L (ref 135–145)
Total Bilirubin: 0.8 mg/dL (ref 0.3–1.2)

## 2013-02-02 LAB — CBC
MCH: 31.6 pg (ref 26.0–34.0)
MCHC: 35.6 g/dL (ref 30.0–36.0)
Platelets: 287 10*3/uL (ref 150–400)

## 2013-02-02 LAB — HCG, SERUM, QUALITATIVE: Preg, Serum: NEGATIVE

## 2013-02-02 NOTE — Pre-Procedure Instructions (Signed)
Donita Pacifico  02/02/2013   Your procedure is scheduled on:   Monday, September 22nd  Report to [facility] at 1030 AM.  Call this number if you have problems the morning of surgery: [phone]   Remember:   Do not eat food or drink liquids after midnight.   Take these medicines the morning of surgery with A SIP OF WATER: Hydrocodone if needed   Do not wear jewelry, make-up or nail polish.  Do not wear lotions, powders, or perfumes. You may wear deodorant.  Do not shave 48 hours prior to surgery. Men may shave face and neck.  Do not bring valuables to the hospital.  [facility] is not responsible  for any belongings or valuables.  Contacts, dentures or bridgework may not be worn into surgery.  Leave suitcase in the car. After surgery it may be brought to your room.  For patients admitted to the hospital, checkout time is 11:00 AM the day of discharge.   Patients discharged the day of surgery will not be allowed to drive home.    Special Instructions: Shower using CHG 2 nights before surgery and the night before surgery.  If you shower the day of surgery use CHG.  Use special wash - you have one bottle of CHG for all showers.  You should use approximately 1/3 of the bottle for each shower.   Please read over the following fact sheets that you were given: Pain Booklet, Coughing and Deep Breathing, MRSA Information and Surgical Site Infection Prevention

## 2013-02-02 NOTE — Progress Notes (Signed)
Primary physician - none Does not have a cardiologist ekg in epic 2014

## 2013-02-03 NOTE — H&P (Signed)
[facility]   A Division of [employer] [facility]   [address] [place], [place] [address] Telephone: [phone]  Fax: [phone]     PATIENT: [name], [name]   MR#: [id number]  DOB: [birth year]       CHIEF COMPLAINT:  Recurrent large HNP with radiculopathy right L4-5.     This 37 year old [place] female had previous microdiskectomy 08/18/2012 and got complete back pain and leg pain relief after the surgery for 2 months.  She was off pain medication, ambulatory, active, and in May developed recurrent back and left leg pain with left leg weakness exactly as before.  Patient had a massive large HNP with significant compression.  She had failed epidural steroid injections, activity modification.  She has had persistent pain since May with leg pain, inability to get upright, standing position, leg weakness, anterior tib weakness, partial footdrop, and MRI on 12/22/2012 showed large recurrent disk herniation L4-5 with some portion of the disk herniation more prominent on the right side of the canal and the disk material virtually obliterates all subarachnoid space.  MRI also showed some degenerative changes at L5-S1 with annular tear all other levels above, L4-5 showed normal hydration without evidence of compression.    CURRENT MEDICATIONS:  Patient has been taking Vicodin for the pain.  Pain has persisted now since May for 4 months.  She has been on Neurontin, recently tramadol for pain, and now hydrocodone for pain, she uses Flexeril at night.  She is not on any normal medications prior to her back problems.   ALLERGIES:  None.   SOCIAL HISTORY:   She is married to her husband [place].  She does not smoke or drink, she has been physically active.   FAMILY HISTORY:  Positive for diabetes and hypertension.     PHYSICAL EXAMINATION:  Patient is alert and oriented, WD, WN, NAD, extraocular movement is intact. She is 5 feet tall, 112 pounds.  Lungs are clear.  She has  had previous breast augmentation.  There is some curvature of her back without true scoliosis.  No rib rotation.  Well healed lumbar incision.  She has sciatic notch tenderness, positive straight leg raising 45 degrees, left partial footdrop, EHL, anterior tib are easily overcome with finger pressure and has positive straight leg raising on the right at 60 degrees.  Sensory test shows decreased sensation dorsum of the left foot and slight decreased sensation dorsal right foot.  Gastrocsoleus is strong bilaterally.     RADIOGRAPHS:  MRI scan shows large HNP occupying more than 75% of the canal, which is now right paracentral with significant compression.     PLAN:  Recurrent microdiskectomy, long discussion with the patient for options for surgery including fusion of the 4-5 level versus microdiskectomy.  She has some degenerative changes at 5-1.  We discussed adjacent disk disease.  If she rerupture's after this procedure then fusion would be recommended.  She had several months of complete pain relief after removal of large HNP until she reruptured a large fragment.  We discussed risks of recurrent disk herniation of 5-10%.  Risks of surgery discussed including increased risk of dural tear.  We discussed risk of infection, anesthesia.  Activity modification after surgery discussed again.  All questions answered.  She understands and requests we proceed.   For additional information please see handwritten notes, reports, orders and prescriptions in this chart.      Mark C. Ophelia Charter, M.D.    Auto-Authenticated by Veverly Fells.  Ophelia Charter, M.D.

## 2013-02-08 MED ORDER — CEFAZOLIN SODIUM-DEXTROSE 2-3 GM-% IV SOLR
2.0000 g | INTRAVENOUS | Status: AC
  Administered 2013-02-09: 2 g via INTRAVENOUS
  Filled 2013-02-08: qty 50

## 2013-02-09 ENCOUNTER — Ambulatory Visit (HOSPITAL_COMMUNITY): Payer: BC Managed Care – PPO

## 2013-02-09 ENCOUNTER — Encounter (HOSPITAL_COMMUNITY)
Admission: RE | Disposition: A | Discharge status: Home / Self Care | Payer: Self-pay | Source: Ambulatory Visit | Attending: Orthopaedic Surgery

## 2013-02-09 ENCOUNTER — Encounter (HOSPITAL_COMMUNITY): Payer: Self-pay | Admitting: *Deleted

## 2013-02-09 ENCOUNTER — Encounter (HOSPITAL_COMMUNITY): Payer: Self-pay | Admitting: Anesthesiology

## 2013-02-09 ENCOUNTER — Ambulatory Visit (HOSPITAL_COMMUNITY): Payer: BC Managed Care – PPO | Admitting: Anesthesiology

## 2013-02-09 ENCOUNTER — Observation Stay (HOSPITAL_COMMUNITY)
Admission: RE | Admit: 2013-02-09 | Discharge: 2013-02-10 | Disposition: A | Discharge status: Home / Self Care | Payer: BC Managed Care – PPO | Source: Ambulatory Visit | Attending: Orthopaedic Surgery | Admitting: Orthopaedic Surgery

## 2013-02-09 DIAGNOSIS — Z23 Encounter for immunization: Secondary | ICD-10-CM | POA: Insufficient documentation

## 2013-02-09 DIAGNOSIS — M5126 Other intervertebral disc displacement, lumbar region: Principal | ICD-10-CM | POA: Insufficient documentation

## 2013-02-09 HISTORY — PX: LUMBAR LAMINECTOMY: SHX95

## 2013-02-09 SURGERY — MICRODISCECTOMY LUMBAR LAMINECTOMY
Anesthesia: General | Site: Back | Wound class: Clean

## 2013-02-09 MED ORDER — INFLUENZA VAC SPLIT QUAD 0.5 ML IM SUSP
0.5000 mL | INTRAMUSCULAR | Status: AC
  Administered 2013-02-10: 0.5 mL via INTRAMUSCULAR
  Filled 2013-02-09: qty 0.5

## 2013-02-09 MED ORDER — LIDOCAINE HCL (CARDIAC) 20 MG/ML IV SOLN
INTRAVENOUS | Status: DC | PRN
  Administered 2013-02-09: 10 mg via INTRAVENOUS

## 2013-02-09 MED ORDER — SENNOSIDES-DOCUSATE SODIUM 8.6-50 MG PO TABS
1.0000 | ORAL_TABLET | Freq: Every evening | ORAL | Status: DC | PRN
  Filled 2013-02-09: qty 1

## 2013-02-09 MED ORDER — GLYCOPYRROLATE 0.2 MG/ML IJ SOLN
INTRAMUSCULAR | Status: DC | PRN
  Administered 2013-02-09: 0.6 mg via INTRAVENOUS

## 2013-02-09 MED ORDER — PHENOL 1.4 % MT LIQD: 1.0000 | OROMUCOSAL | Status: DC | PRN

## 2013-02-09 MED ORDER — PROMETHAZINE HCL 25 MG/ML IJ SOLN: 6.2500 mg | INTRAMUSCULAR | Status: DC | PRN

## 2013-02-09 MED ORDER — NEOSTIGMINE METHYLSULFATE 1 MG/ML IJ SOLN
INTRAMUSCULAR | Status: DC | PRN
  Administered 2013-02-09: 4 mg via INTRAVENOUS

## 2013-02-09 MED ORDER — MIDAZOLAM HCL 2 MG/2ML IJ SOLN: 0.5000 mg | Freq: Once | INTRAMUSCULAR | Status: DC | PRN

## 2013-02-09 MED ORDER — FENTANYL CITRATE 0.05 MG/ML IJ SOLN
INTRAMUSCULAR | Status: DC | PRN
  Administered 2013-02-09: 50 ug via INTRAVENOUS
  Administered 2013-02-09: 200 ug via INTRAVENOUS
  Administered 2013-02-09: 50 ug via INTRAVENOUS

## 2013-02-09 MED ORDER — HYDROMORPHONE HCL PF 1 MG/ML IJ SOLN
0.2500 mg | INTRAMUSCULAR | Status: DC | PRN
  Administered 2013-02-09 (×2): 0.5 mg via INTRAVENOUS

## 2013-02-09 MED ORDER — MORPHINE SULFATE 2 MG/ML IJ SOLN: 1.0000 mg | INTRAMUSCULAR | Status: DC | PRN

## 2013-02-09 MED ORDER — HYDROMORPHONE HCL PF 1 MG/ML IJ SOLN
INTRAMUSCULAR | Status: AC
  Filled 2013-02-09: qty 1

## 2013-02-09 MED ORDER — 0.9 % SODIUM CHLORIDE (POUR BTL) OPTIME
TOPICAL | Status: DC | PRN
  Administered 2013-02-09: 1000 mL

## 2013-02-09 MED ORDER — SODIUM CHLORIDE 0.9 % IJ SOLN: 3.0000 mL | Freq: Two times a day (BID) | INTRAMUSCULAR | Status: DC

## 2013-02-09 MED ORDER — HEMOSTATIC AGENTS (NO CHARGE) OPTIME
TOPICAL | Status: DC | PRN
  Administered 2013-02-09: 1 via TOPICAL

## 2013-02-09 MED ORDER — METHOCARBAMOL 500 MG PO TABS: 500.0000 mg | ORAL_TABLET | Freq: Four times a day (QID) | ORAL | Status: AC

## 2013-02-09 MED ORDER — ONDANSETRON HCL 4 MG/2ML IJ SOLN
INTRAMUSCULAR | Status: DC | PRN
  Administered 2013-02-09: 4 mg via INTRAVENOUS

## 2013-02-09 MED ORDER — DEXTROSE 5 % IV SOLN
INTRAVENOUS | Status: DC | PRN
  Administered 2013-02-09: 13:00:00 via INTRAVENOUS

## 2013-02-09 MED ORDER — BISACODYL 10 MG RE SUPP: 10.0000 mg | Freq: Every day | RECTAL | Status: DC | PRN

## 2013-02-09 MED ORDER — ACETAMINOPHEN 325 MG PO TABS: 650.0000 mg | ORAL_TABLET | ORAL | Status: DC | PRN

## 2013-02-09 MED ORDER — ONDANSETRON HCL 4 MG/2ML IJ SOLN: 4.0000 mg | INTRAMUSCULAR | Status: DC | PRN

## 2013-02-09 MED ORDER — ACETAMINOPHEN 650 MG RE SUPP: 650.0000 mg | RECTAL | Status: DC | PRN

## 2013-02-09 MED ORDER — DOCUSATE SODIUM 100 MG PO CAPS
100.0000 mg | ORAL_CAPSULE | Freq: Two times a day (BID) | ORAL | Status: DC
  Administered 2013-02-09: 100 mg via ORAL
  Filled 2013-02-09: qty 1

## 2013-02-09 MED ORDER — THROMBIN 20000 UNITS EX SOLR
CUTANEOUS | Status: DC | PRN
  Administered 2013-02-09: 20000 [IU] via TOPICAL

## 2013-02-09 MED ORDER — KCL IN DEXTROSE-NACL 20-5-0.45 MEQ/L-%-% IV SOLN
INTRAVENOUS | Status: DC
  Administered 2013-02-09: 18:00:00 via INTRAVENOUS
  Filled 2013-02-09 (×3): qty 1000

## 2013-02-09 MED ORDER — OXYCODONE HCL 5 MG PO TABS: 5.0000 mg | ORAL_TABLET | Freq: Once | ORAL | Status: DC | PRN

## 2013-02-09 MED ORDER — METHOCARBAMOL 100 MG/ML IJ SOLN
500.0000 mg | Freq: Four times a day (QID) | INTRAVENOUS | Status: DC | PRN
  Filled 2013-02-09: qty 5

## 2013-02-09 MED ORDER — ROCURONIUM BROMIDE 100 MG/10ML IV SOLN
INTRAVENOUS | Status: DC | PRN
  Administered 2013-02-09: 40 mg via INTRAVENOUS
  Administered 2013-02-09: 10 mg via INTRAVENOUS

## 2013-02-09 MED ORDER — PROPOFOL 10 MG/ML IV BOLUS
INTRAVENOUS | Status: DC | PRN
  Administered 2013-02-09: 120 mg via INTRAVENOUS

## 2013-02-09 MED ORDER — CEFAZOLIN SODIUM 1-5 GM-% IV SOLN
1.0000 g | Freq: Three times a day (TID) | INTRAVENOUS | Status: AC
  Administered 2013-02-09 – 2013-02-10 (×2): 1 g via INTRAVENOUS
  Filled 2013-02-09 (×2): qty 50

## 2013-02-09 MED ORDER — THROMBIN 20000 UNITS EX SOLR
CUTANEOUS | Status: AC
  Filled 2013-02-09: qty 20000

## 2013-02-09 MED ORDER — LACTATED RINGERS IV SOLN
INTRAVENOUS | Status: DC | PRN
  Administered 2013-02-09 (×2): via INTRAVENOUS

## 2013-02-09 MED ORDER — MENTHOL 3 MG MT LOZG: 1.0000 | LOZENGE | OROMUCOSAL | Status: DC | PRN

## 2013-02-09 MED ORDER — OXYCODONE-ACETAMINOPHEN 5-325 MG PO TABS
1.0000 | ORAL_TABLET | ORAL | Status: DC | PRN
  Administered 2013-02-09 – 2013-02-10 (×2): 2 via ORAL
  Filled 2013-02-09 (×2): qty 2

## 2013-02-09 MED ORDER — ETODOLAC 400 MG PO TABS
400.0000 mg | ORAL_TABLET | Freq: Two times a day (BID) | ORAL | Status: DC | PRN
  Filled 2013-02-09: qty 1

## 2013-02-09 MED ORDER — FLEET ENEMA 7-19 GM/118ML RE ENEM
1.0000 | ENEMA | Freq: Once | RECTAL | Status: AC | PRN
  Filled 2013-02-09: qty 1

## 2013-02-09 MED ORDER — HYDROCODONE-ACETAMINOPHEN 5-325 MG PO TABS: 1.0000 | ORAL_TABLET | ORAL | Status: DC | PRN

## 2013-02-09 MED ORDER — ARTIFICIAL TEARS OP OINT
TOPICAL_OINTMENT | OPHTHALMIC | Status: DC | PRN
  Administered 2013-02-09: 1 via OPHTHALMIC

## 2013-02-09 MED ORDER — OXYCODONE HCL 5 MG/5ML PO SOLN: 5.0000 mg | Freq: Once | ORAL | Status: DC | PRN

## 2013-02-09 MED ORDER — KETOROLAC TROMETHAMINE 30 MG/ML IJ SOLN
30.0000 mg | Freq: Four times a day (QID) | INTRAMUSCULAR | Status: DC
  Administered 2013-02-09 – 2013-02-10 (×3): 30 mg via INTRAVENOUS
  Filled 2013-02-09 (×4): qty 1

## 2013-02-09 MED ORDER — SCOPOLAMINE 1 MG/3DAYS TD PT72
1.0000 | MEDICATED_PATCH | TRANSDERMAL | Status: DC
  Administered 2013-02-09: 1 via TRANSDERMAL
  Filled 2013-02-09: qty 1

## 2013-02-09 MED ORDER — DEXAMETHASONE SODIUM PHOSPHATE 4 MG/ML IJ SOLN
INTRAMUSCULAR | Status: DC | PRN
  Administered 2013-02-09: 8 mg via INTRAVENOUS

## 2013-02-09 MED ORDER — LACTATED RINGERS IV SOLN
INTRAVENOUS | Status: DC
  Administered 2013-02-09: 11:00:00 via INTRAVENOUS

## 2013-02-09 MED ORDER — MIDAZOLAM HCL 5 MG/5ML IJ SOLN
INTRAMUSCULAR | Status: DC | PRN
  Administered 2013-02-09: 1 mg via INTRAVENOUS

## 2013-02-09 MED ORDER — SODIUM CHLORIDE 0.9 % IV SOLN: 250.0000 mL | INTRAVENOUS | Status: DC

## 2013-02-09 MED ORDER — SODIUM CHLORIDE 0.9 % IJ SOLN: 3.0000 mL | INTRAMUSCULAR | Status: DC | PRN

## 2013-02-09 MED ORDER — OXYCODONE-ACETAMINOPHEN 5-325 MG PO TABS: 1.0000 | ORAL_TABLET | ORAL | Status: AC | PRN

## 2013-02-09 MED ORDER — ZOLPIDEM TARTRATE 5 MG PO TABS: 5.0000 mg | ORAL_TABLET | Freq: Every evening | ORAL | Status: DC | PRN

## 2013-02-09 MED ORDER — METHOCARBAMOL 500 MG PO TABS
500.0000 mg | ORAL_TABLET | Freq: Four times a day (QID) | ORAL | Status: DC | PRN
  Administered 2013-02-09 – 2013-02-10 (×2): 500 mg via ORAL
  Filled 2013-02-09 (×2): qty 1

## 2013-02-09 MED ORDER — PANTOPRAZOLE SODIUM 40 MG IV SOLR
40.0000 mg | Freq: Every day | INTRAVENOUS | Status: DC
  Administered 2013-02-09: 40 mg via INTRAVENOUS
  Filled 2013-02-09 (×2): qty 40

## 2013-02-09 MED ORDER — MEPERIDINE HCL 25 MG/ML IJ SOLN: 6.2500 mg | INTRAMUSCULAR | Status: DC | PRN

## 2013-02-09 SURGICAL SUPPLY — 47 items
ADH SKN CLS APL DERMABOND .7 (GAUZE/BANDAGES/DRESSINGS) ×1
BUR ROUND FLUTED 4 SOFT TCH (BURR) ×2 IMPLANT
CLOTH BEACON ORANGE TIMEOUT ST (SAFETY) ×2 IMPLANT
CORDS BIPOLAR (ELECTRODE) ×2 IMPLANT
COVER SURGICAL LIGHT HANDLE (MISCELLANEOUS) ×2 IMPLANT
DERMABOND ADVANCED (GAUZE/BANDAGES/DRESSINGS) ×1
DERMABOND ADVANCED .7 DNX12 (GAUZE/BANDAGES/DRESSINGS) ×1 IMPLANT
DRAPE MICROSCOPE LEICA (MISCELLANEOUS) ×2 IMPLANT
DRAPE PROXIMA HALF (DRAPES) ×4 IMPLANT
DRSG EMULSION OIL 3X3 NADH (GAUZE/BANDAGES/DRESSINGS) ×2 IMPLANT
DRSG MEPILEX BORDER 4X4 (GAUZE/BANDAGES/DRESSINGS) ×2 IMPLANT
DRSG MEPILEX BORDER 4X8 (GAUZE/BANDAGES/DRESSINGS) ×2 IMPLANT
DURAPREP 26ML APPLICATOR (WOUND CARE) ×2 IMPLANT
ELECT REM PT RETURN 9FT ADLT (ELECTROSURGICAL) ×2
ELECTRODE REM PT RTRN 9FT ADLT (ELECTROSURGICAL) ×1 IMPLANT
GLOVE BIOGEL PI IND STRL 7.5 (GLOVE) ×1 IMPLANT
GLOVE BIOGEL PI IND STRL 8 (GLOVE) ×1 IMPLANT
GLOVE BIOGEL PI INDICATOR 7.5 (GLOVE) ×1
GLOVE BIOGEL PI INDICATOR 8 (GLOVE) ×1
GLOVE ECLIPSE 7.0 STRL STRAW (GLOVE) ×2 IMPLANT
GLOVE ORTHO TXT STRL SZ7.5 (GLOVE) ×2 IMPLANT
GOWN PREVENTION PLUS LG XLONG (DISPOSABLE) IMPLANT
GOWN STRL NON-REIN LRG LVL3 (GOWN DISPOSABLE) ×6 IMPLANT
KIT BASIN OR (CUSTOM PROCEDURE TRAY) ×2 IMPLANT
KIT ROOM TURNOVER OR (KITS) ×2 IMPLANT
MANIFOLD NEPTUNE II (INSTRUMENTS) ×2 IMPLANT
NDL SUT .5 MAYO 1.404X.05X (NEEDLE) ×1 IMPLANT
NEEDLE 22X1 1/2 (OR ONLY) (NEEDLE) ×2 IMPLANT
NEEDLE MAYO TAPER (NEEDLE) ×2
NEEDLE SPNL 18GX3.5 QUINCKE PK (NEEDLE) ×2 IMPLANT
NS IRRIG 1000ML POUR BTL (IV SOLUTION) ×2 IMPLANT
PACK LAMINECTOMY ORTHO (CUSTOM PROCEDURE TRAY) ×2 IMPLANT
PAD ARMBOARD 7.5X6 YLW CONV (MISCELLANEOUS) ×4 IMPLANT
PATTIES SURGICAL .5 X.5 (GAUZE/BANDAGES/DRESSINGS) ×2 IMPLANT
PATTIES SURGICAL .75X.75 (GAUZE/BANDAGES/DRESSINGS) IMPLANT
SPONGE GAUZE 4X4 12PLY (GAUZE/BANDAGES/DRESSINGS) ×2 IMPLANT
SPONGE SURGIFOAM ABS GEL SZ50 (HEMOSTASIS) ×2 IMPLANT
SUT VIC AB 2-0 CT1 27 (SUTURE) ×1
SUT VIC AB 2-0 CT1 TAPERPNT 27 (SUTURE) ×1 IMPLANT
SUT VICRYL 0 TIES 12 18 (SUTURE) ×2 IMPLANT
SUT VICRYL 4-0 PS2 18IN ABS (SUTURE) IMPLANT
SUT VICRYL AB 2 0 TIES (SUTURE) ×2 IMPLANT
SYR 20ML ECCENTRIC (SYRINGE) IMPLANT
SYR CONTROL 10ML LL (SYRINGE) ×2 IMPLANT
TOWEL OR 17X24 6PK STRL BLUE (TOWEL DISPOSABLE) ×2 IMPLANT
TOWEL OR 17X26 10 PK STRL BLUE (TOWEL DISPOSABLE) ×2 IMPLANT
WATER STERILE IRR 1000ML POUR (IV SOLUTION) ×2 IMPLANT

## 2013-02-09 NOTE — Op Note (Signed)
Test test  Preop diagnosis: Recurrent right L4-5 large HNP with radiculopathy  Postop diagnosis: Same  Procedure: Right L4-5 microdiscectomy were with removal of large HNP, right hemilaminectomy and foraminotomies.  Surgeon: Annell Greening M.D.  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 She did well for several weeks and then had development of recurrent back and right more than left leg pain. Pain progressed where she required narcotic medication and new MRI showed large HNP with severe stenosis due to disc herniation. Patient had some endplate spurring no instability and return for her microdiscectomy for recurrent HNP. After her first procedure she was off pain medication with good relief of back and leg pain for a few months until her disc rupture recurred.  Procedure: After induction general anesthesia patient was placed prone on chest rolls careful padding and positioning yellow foam rolled up underneath the anterior shoulders padding ulnar nerve. Back was prepped with DuraPrep there was squared with towels preoperative Ancef was given prophylactically. Patient warmer was applied by CRNA. After row DuraPrep was dry timeout procedure was completed area square totalis Betadine Steri-Drape applied. Laminectomy sheets and drapes were applied. Patient had the k formation from a previous subarticular skin closure and scar was excised. Subperiosteal dissection of the lamina with scar tissue present in the previous laminotomy noted in the [place] 4 was placed at this level and crosstable lateral x-ray showed was directly over the for 5 interspace. Lamina was thinned with the run sure then with the  bur and microscope was draped brought in and the hemilaminectomy was performed removing muscle lamina were leaving a couple millimeters proximally. Overhanging facets returned back Giemsa ligament were removed. There was a dense adhesions between the disc and the nerve root as well as the dura centrally. More bone ligament was removed laterally until we were at the level the pedicle. Facet was hypermobile but pars was intact. Foraminal was enlarged. Top portion the disc was visualized and small incision was made a 15 scalpel blade and this was gradually developed until it opened up and the several massive pieces of disc were removed. This freed up the dura more easily and improved the mobility the nerve root. Some disc was present partially up the foramina adherent anterior aspect of this this was carefully removed with there is scar tissue cut with 15 scalpel blade with the nerve root protected with the Dureco. Additional passes were made with the micropituitary up, straight, and  down biting and then regular pituitary removing degenerative portions of the disc and this fragments. Hockey-stick be passed anterior to the dura there are is compression dura was soft. Dura was intact endplate spurring was noted above and below the disc is visualized the preop MRI. Nerve root was free up the foramina and foraminal was enlarged. Area was irrigated. Some of vessels were coagulated with bipolar cautery and some thrombin Gelfoam was placed down on the floor to some areas was some small venous bleeders. A few minutes this was dry. Final pass was made hockey-stick with all layers free and the lateral talar tract are that been held with 2 pound weight was then removed and sent her layer closure with the #1 the fascia 2-0 Vicryl subcuticular 4 Vicryl septic her  skin closure postop dressing after Dermabond. Patient is extubated transferred recovery room in stable condition. Signed,     Vernon Prey.D.

## 2013-02-09 NOTE — Transfer of Care (Signed)
Immediate Anesthesia Transfer of Care Note  Patient: [name]  Procedure(s) Performed: Procedure(s): RIGHT Lumbar 4-Lumbar 5 MICRODISCECTOMY (N/A)  Patient Location: PACU  Anesthesia Type:General  Level of Consciousness: awake and patient cooperative  Airway & Oxygen Therapy: Patient Spontanous Breathing and Patient connected to nasal cannula oxygen  Post-op Assessment: Report given to PACU RN, Post -op Vital signs reviewed and stable and Patient moving all extremities  Post vital signs: Reviewed and stable  Complications: No apparent anesthesia complications

## 2013-02-09 NOTE — Anesthesia Preprocedure Evaluation (Addendum)
Anesthesia Evaluation  Patient identified by MRN, date of birth, ID band Patient awake    Reviewed: Allergy & Precautions  History of Anesthesia Complications (+) PONV  Airway Mallampati: II TM Distance: >3 FB Neck ROM: Full    Dental  (+) Dental Advisory Given   Pulmonary neg pulmonary ROS,  breath sounds clear to auscultation  Pulmonary exam normal       Cardiovascular negative cardio ROS  Rhythm:Regular Rate:Normal     Neuro/Psych Chronic back pain: narcotics negative psych ROS   GI/Hepatic negative GI ROS, Neg liver ROS,   Endo/Other  negative endocrine ROS  Renal/GU negative Renal ROS     Musculoskeletal   Abdominal   Peds  Hematology negative hematology ROS (+)   Anesthesia Other Findings   Reproductive/Obstetrics 02/02/13  preg test: NEG                          Anesthesia Physical Anesthesia Plan  ASA: II  Anesthesia Plan: General   Post-op Pain Management:    Induction: Intravenous  Airway Management Planned: Oral ETT  Additional Equipment:   Intra-op Plan:   Post-operative Plan: Extubation in OR  Informed Consent: I have reviewed the patients History and Physical, chart, labs and discussed the procedure including the risks, benefits and alternatives for the proposed anesthesia with the patient or authorized representative who has indicated his/her understanding and acceptance.   Dental advisory given  Plan Discussed with: CRNA and Surgeon  Anesthesia Plan Comments: (Plan routine monitors, GETA)       Anesthesia Quick Evaluation

## 2013-02-09 NOTE — Brief Op Note (Signed)
02/09/2013  2:44 PM  PATIENT:  [name]  37 y.o. female  PRE-OPERATIVE DIAGNOSIS:  Large right L4-5 recurrent herniated nucleus pulposus right  POST-OPERATIVE DIAGNOSIS:  Recurrent Herniated nucleus pulposus Lumbar four- lumbar five right  PROCEDURE:  Procedure(s): RIGHT Lumbar 4-Lumbar 5 MICRODISCECTOMY (N/A)  SURGEON:  Surgeon(s) and Role:    * Eldred Manges, MD - Primary  PHYSICIAN ASSISTANT: Maud Deed PAC  ASSISTANTS: none   ANESTHESIA:   general  EBL:  Total I/O In: 1050 [I.V.:1050] Out: 150 [Blood:150]  BLOOD ADMINISTERED:none  DRAINS: none   LOCAL MEDICATIONS USED:  NONE  SPECIMEN:  No Specimen  DISPOSITION OF SPECIMEN:  N/A  COUNTS:  YES  TOURNIQUET:  * No tourniquets in log *  DICTATION: .Note written in EPIC  PLAN OF CARE: Admit for overnight observation  PATIENT DISPOSITION:  PACU - hemodynamically stable.   Delay start of Pharmacological VTE agent (>24hrs) due to surgical blood loss or risk of bleeding: yes

## 2013-02-09 NOTE — Anesthesia Postprocedure Evaluation (Signed)
  Anesthesia Post-op Note  Patient: [name]  Procedure(s) Performed: Procedure(s): RIGHT Lumbar 4-Lumbar 5 MICRODISCECTOMY (N/A)  Patient Location: PACU  Anesthesia Type:General  Level of Consciousness: awake, alert , oriented and patient cooperative  Airway and Oxygen Therapy: Patient Spontanous Breathing  Post-op Pain: mild  Post-op Assessment: Post-op Vital signs reviewed, Patient's Cardiovascular Status Stable, Respiratory Function Stable, Patent Airway, No signs of Nausea or vomiting and Pain level controlled  Post-op Vital Signs: Reviewed and stable  Complications: No apparent anesthesia complications

## 2013-02-09 NOTE — Interval H&P Note (Signed)
History and Physical Interval Note:  02/09/2013 12:37 PM  [name]  has presented today for surgery, with the diagnosis of Large right L4-5 recurrent herniated nucleus pulposus  The various methods of treatment have been discussed with the patient and family. After consideration of risks, benefits and other options for treatment, the patient has consented to  Procedure(s): RIGHT L4-5 MICRODISCECTOMY (N/A) as a surgical intervention .  The patient's history has been reviewed, patient examined, no change in status, stable for surgery.  I have reviewed the patient's chart and labs.  Questions were answered to the patient's satisfaction.     YATES,MARK C

## 2013-02-09 NOTE — Anesthesia Procedure Notes (Signed)
Procedure Name: Intubation Date/Time: 02/09/2013 12:48 PM Performed by: Darcey Nora B Pre-anesthesia Checklist: Patient identified, Patient being monitored, Emergency Drugs available and Suction available Patient Re-evaluated:Patient Re-evaluated prior to inductionOxygen Delivery Method: Circle system utilized Preoxygenation: Pre-oxygenation with 100% oxygen Intubation Type: IV induction Ventilation: Mask ventilation without difficulty Laryngoscope Size: Mac and 3 Grade View: Grade I Tube type: Oral Tube size: 7.5 mm Number of attempts: 1 Airway Equipment and Method: Stylet Placement Confirmation: ETT inserted through vocal cords under direct vision,  breath sounds checked- equal and bilateral and positive ETCO2 Secured at: 21 (cm at teeth) cm Tube secured with: Tape Dental Injury: Teeth and Oropharynx as per pre-operative assessment

## 2013-02-09 NOTE — Preoperative (Signed)
Beta Blockers   Reason not to administer Beta Blockers:Not Applicable 

## 2013-02-10 ENCOUNTER — Encounter (HOSPITAL_COMMUNITY): Payer: Self-pay | Admitting: Orthopaedic Surgery

## 2013-02-10 NOTE — Progress Notes (Signed)
Pt doing well. Pt given D/C instructions with Rx's, verbal understanding given. Pt D/C'd home via wheelchair @ [phone] per MD order. Pt stable @ D/C. Rema Fendt, RN

## 2013-02-20 NOTE — Discharge Summary (Signed)
Physician Discharge Summary  Patient ID: [name] MRN: [id number] DOB/AGE: 38-Dec-1977 38 y.o.  Admit date: 02-22-13 Discharge date: 02/20/2013  Admission Diagnoses:  HNP (herniated nucleus pulposus), lumbar right L4-5,  Recurrent  Discharge Diagnoses:  Principal Problem:   HNP (herniated nucleus pulposus), lumbar right L4-5  RECURRENT  Past Medical History  Diagnosis Date  . PONV (postoperative nausea and vomiting)   . Headache(784.0)     Surgeries: Procedure(s): RIGHT Lumbar 4-Lumbar 5 MICRODISCECTOMY on 02/22/2013   Consultants (if any):    Discharged Condition: Improved  Hospital Course: [name] is an 37 y.o. female who was admitted 02-22-13 with a diagnosis of HNP (herniated nucleus pulposus), lumbar recurrent  and went to the operating room on February 22, 2013 and underwent the above named procedures.  She had had previous surgery with complete relief of pain and radiculopathy and then several months later developed a very large HNP with progressive weakness and massive HNP by MRI at the same level.   She was given perioperative antibiotics:  Anti-infectives   Start     Dose/Rate Route Frequency Ordered Stop   22-Feb-2013 2030  ceFAZolin (ANCEF) IVPB 1 g/50 mL premix     1 g 100 mL/hr over 30 Minutes Intravenous Every 8 hours February 22, 2013 1639 02/10/13 0511   2013-02-22 0600  ceFAZolin (ANCEF) IVPB 2 g/50 mL premix     2 g 100 mL/hr over 30 Minutes Intravenous On call to O.R. 02/08/13 1415 22-Feb-2013 1240    .  She was given sequential compression devices, early ambulation for DVT prophylaxis.  She benefited maximally from the hospital stay and there were no complications.    Recent vital signs:  Filed Vitals:   02/10/13 0806  BP: 95/64  Pulse: 97  Temp: 98.9 F (37.2 C)  Resp: 16    Recent laboratory studies:  Lab Results  Component Value Date   HGB 14.3 02/02/2013   HGB 13.6 08/11/2012   Lab Results  Component Value Date   WBC 8.7 02/02/2013   PLT 287 02/02/2013    No results found for this basename: INR   Lab Results  Component Value Date   NA 136 02/02/2013   K 4.6 02/02/2013   CL 100 02/02/2013   CO2 24 02/02/2013   BUN 10 02/02/2013   CREATININE 0.49* 02/02/2013   GLUCOSE 92 02/02/2013    Discharge Medications:     Medication List    STOP taking these medications       HYDROcodone-acetaminophen 5-325 MG per tablet  Commonly known as:  NORCO      TAKE these medications       ADVIL PM PO  Take 1 tablet by mouth at bedtime as needed (to help sleep).     etodolac 400 MG tablet  Commonly known as:  LODINE  Take 400 mg by mouth 2 (two) times daily as needed (pain).     methocarbamol 500 MG tablet  Commonly known as:  ROBAXIN  Take 1 tablet (500 mg total) by mouth 4 (four) times daily.     oxyCODONE-acetaminophen 5-325 MG per tablet  Commonly known as:  ROXICET  Take 1-2 tablets by mouth every 4 (four) hours as needed for pain (pain).        Diagnostic Studies: Dg Lumbar Spine 1 View  02-22-13   CLINICAL DATA:  Lumbar decompression and L4-5.  EXAM: LUMBAR SPINE - 1 VIEW  COMPARISON:  MRI 12/19/2012  FINDINGS: Single lateral intraoperative view, labeled 1313 hr. This demonstrates a  surgical device projecting posterior to the L4-5 level.  IMPRESSION: Intraoperative localization of L4-5.   Electronically Signed   By: Jeronimo Greaves   On: 02/09/2013 15:30    Disposition: 01-Home or Self Care No lifting greater than 10 lbs. Avoid bending, stooping and twisting. Walk in house for first week them may start to get out slowly increasing distance up to one mile by 3 weeks post op. Keep incision dry for 3 days, may use tegaderm or similar water impervious dressing. Ice packs daily to back.  Change dressing daily or as needed.       Follow-up Information   Follow up with Eldred Manges, MD. Schedule an appointment as soon as possible for a visit in 2 weeks.   Specialty:  Orthopedic Surgery   Contact information:   [address] [place] [address] [phone]        Signed: Wende Neighbors 02/20/2013, 12:10 PM

## 2013-11-19 ENCOUNTER — Ambulatory Visit (INDEPENDENT_AMBULATORY_CARE_PROVIDER_SITE_OTHER): Payer: BC Managed Care – PPO | Admitting: Physician Assistant

## 2013-11-19 VITALS — BP 118/76 | HR 77 | Temp 98.0°F | Resp 16 | Ht 64.0 in | Wt 120.8 lb

## 2013-11-19 DIAGNOSIS — L282 Other prurigo: Secondary | ICD-10-CM

## 2013-11-19 DIAGNOSIS — T7840XA Allergy, unspecified, initial encounter: Secondary | ICD-10-CM

## 2013-11-19 MED ORDER — RANITIDINE HCL 150 MG PO TABS: 150.0000 mg | ORAL_TABLET | Freq: Two times a day (BID) | ORAL | Status: AC

## 2013-11-19 MED ORDER — PREDNISONE 20 MG PO TABS: ORAL_TABLET | ORAL | Status: AC

## 2013-11-19 MED ORDER — METHYLPREDNISOLONE SODIUM SUCC 125 MG IJ SOLR
125.0000 mg | Freq: Once | INTRAMUSCULAR | Status: AC
  Administered 2013-11-19: 125 mg via INTRAMUSCULAR

## 2013-11-19 NOTE — Progress Notes (Signed)
   Subjective:    Patient ID: [name], female    DOB: 08-16-1975, 38 y.o.   MRN: [id number]  HPI 38 year old female presents for evaluation of diffuse, erythematous, pruritic rash. States symptoms started last night after eating a mango.  At first she noticed some itching around her mouth and on her face and neck. She took a dose of benadryl and went to sleep. Woke up in the night and noticed spreading of the rash so took another dose of benadryl. Through the day today her symptoms failed to improve so she came in for evaluation. Has slight "itchy" feeling in her throat but no difficulty breathing, SOB, lip/tongue swelling, or wheezing.  No hx of allergies in the past.     Review of Systems  Constitutional: Negative for fever and chills.  Respiratory: Negative for cough, chest tightness, shortness of breath and wheezing.   Skin: Positive for rash.  Neurological: Negative for dizziness.       Objective:   Physical Exam  Constitutional: She is oriented to person, place, and time. She appears well-developed and well-nourished.  HENT:  Head: Normocephalic and atraumatic.  Right Ear: External ear normal.  Left Ear: External ear normal.  Eyes: Conjunctivae are normal.  Neck: Normal range of motion.  Cardiovascular: Normal rate, regular rhythm and normal heart sounds.   Pulmonary/Chest: Effort normal and breath sounds normal.  Neurological: She is alert and oriented to person, place, and time.  Skin:  Diffuse urticarial rash over entire body including face, torso, arms, and legs.   Psychiatric: She has a normal mood and affect. Her behavior is normal. Judgment and thought content normal.          Assessment & Plan:  Allergic reaction, initial encounter - Plan: methylPREDNISolone sodium succinate (SOLU-MEDROL) 125 mg/2 mL injection 125 mg, ranitidine (ZANTAC) 150 MG tablet, predniSONE (DELTASONE) 20 MG tablet  Pruritic rash - Plan: predniSONE (DELTASONE) 20 MG tablet  Solumedrol  125 mg IM today. Start prednisone taper tomorrow.  Recommend she continue Benadryl 25-50 mg at bedtime and Claritin daily in the morning Zantac 150 mg bid. RTC precautions discussed. To ED if acutely worse. Recommend she avoid mangos in the future.

## 2013-11-19 NOTE — Patient Instructions (Signed)
Take Claritin daily in the morning.  Start Zantac 150 mg twice daily.  Benadryl 25-50 mg at bedtime Return or to ER if acutely worse with lip/tongue swelling, trouble breathing, or wheezing      Food Allergy A food allergy occurs from eating something you are sensitive to. Food allergies occur in all age groups. It may be passed to you from your parents (heredity).  CAUSES  Some common causes are cow's milk, seafood, eggs, nuts (including peanut butter), wheat, and soybeans. SYMPTOMS  Common problems are:   Swelling around the mouth.  An itchy, red rash.  Hives.  Vomiting.  Diarrhea. Severe allergic reactions are life-threatening. This reaction is called anaphylaxis. It can cause the mouth and throat to swell. This makes it hard to breathe and swallow. In severe reactions, only a small amount of food may be fatal within seconds. HOME CARE INSTRUCTIONS   If you are unsure what caused the reaction, keep a diary of foods eaten and symptoms that followed. Avoid foods that cause reactions.  If hives or rash are present:  Take medicines as directed.  Use an over-the-counter antihistamine (diphenhydramine) to treat hives and itching as needed.  Apply cold compresses to the skin or take baths in cool water. Avoid hot baths or showers. These will increase the redness and itching.  If you are severely allergic:  Hospitalization is often required following a severe reaction.  Wear a medical alert bracelet or necklace that describes the allergy.  Carry your anaphylaxis kit or epinephrine injection with you at all times. Both you and your family members should know how to use this. This can be lifesaving if you have a severe reaction. If epinephrine is used, it is important for you to seek immediate medical care or call your local emergency services (911 in [place] When the epinephrine wears off, it can be followed by a delayed reaction, which can be fatal.  Replace your epinephrine  immediately after use in case of another reaction.  Ask your caregiver for instructions if you have not been taught how to use an epinephrine injection.  Do not drive until medicines used to treat the reaction have worn off, unless approved by your caregiver. SEEK MEDICAL CARE IF:   You suspect a food allergy. Symptoms generally happen within 30 minutes of eating a food.  Your symptoms have not gone away within 2 days. See your caregiver sooner if symptoms are getting worse.  You develop new symptoms.  You want to retest yourself with a food or drink you think causes an allergic reaction. Never do this if an anaphylactic reaction to that food or drink has happened before.  There is a return of the symptoms which brought you to your caregiver. SEEK IMMEDIATE MEDICAL CARE IF:   You have trouble breathing, are wheezing, or you have a tight feeling in your chest or throat.  You have a swollen mouth, or you have hives, swelling, or itching all over your body. Use your epinephrine injection immediately. This is given into the outside of your thigh, deep into the muscle. Following use of the epinephrine injection, seek help right away. Seek immediate medical care or call your local emergency services (911 in [place] MAKE SURE YOU:   Understand these instructions.  Will watch your condition.  Will get help right away if you are not doing well or get worse. Document Released: 05/04/2000 Document Revised: 07/30/2011 Document Reviewed: 12/25/2007 [facility] Patient Information 2015 [place], [place]. This information is not intended  to replace advice given to you by your health care provider. Make sure you discuss any questions you have with your health care provider.

## 2014-05-28 IMAGING — DX DG LUMBAR SPINE 1V
1 series · 1 of 1 positions shown · non-contrast
Comparison: MRI 12/19/2012

CLINICAL DATA: Lumbar decompression and L4-5.

EXAM:
LUMBAR SPINE - 1 VIEW

[lat]
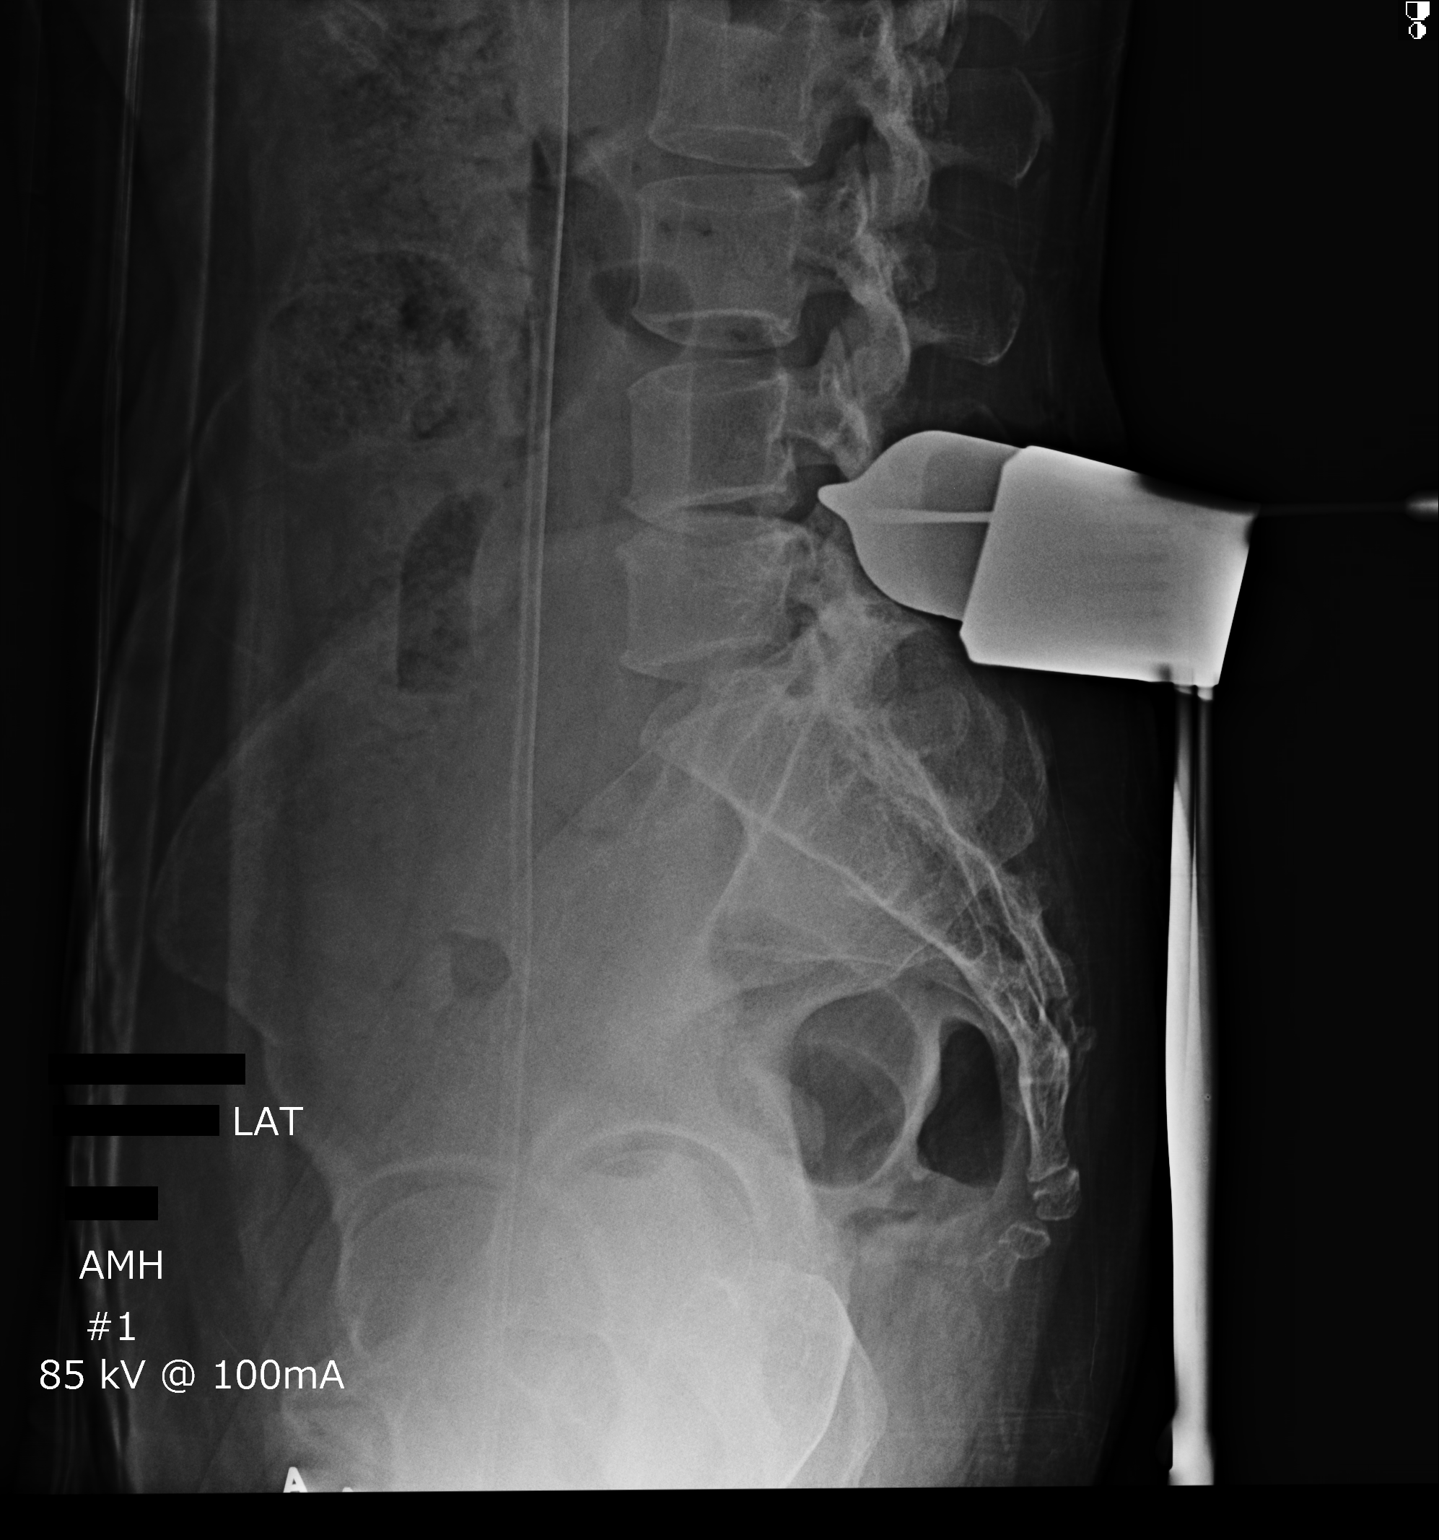

[1 of 1 positions shown; findings below may reference images not displayed]

FINDINGS: Single lateral intraoperative view, labeled 2626 [id number]. This
demonstrates a surgical device projecting posterior to the L4-5
level.
IMPRESSION: Intraoperative localization of L4-5.
